# Patient Record
Sex: Male | Born: 1965 | Race: White | Hispanic: No | Marital: Married | Smoking: Never smoker
Health system: Southern US, Community
[De-identification: ages and names within clinical notes are randomized; demographics above are authoritative.]

---

## 2000-11-10 ENCOUNTER — Encounter: Admission: RE | Admit: 2000-11-10 | Discharge: 2000-11-10 | Payer: Self-pay | Admitting: Infectious Diseases

## 2000-12-01 ENCOUNTER — Encounter: Admission: RE | Admit: 2000-12-01 | Discharge: 2000-12-01 | Payer: Self-pay | Admitting: Infectious Diseases

## 2014-09-04 ENCOUNTER — Ambulatory Visit (INDEPENDENT_AMBULATORY_CARE_PROVIDER_SITE_OTHER): Payer: Self-pay | Admitting: Neurology

## 2014-09-04 ENCOUNTER — Encounter: Payer: Self-pay | Admitting: Neurology

## 2014-09-04 ENCOUNTER — Telehealth: Payer: Self-pay | Admitting: Neurology

## 2014-09-04 DIAGNOSIS — Z0289 Encounter for other administrative examinations: Secondary | ICD-10-CM

## 2014-09-04 NOTE — Telephone Encounter (Signed)
duplicate

## 2014-09-04 NOTE — Telephone Encounter (Signed)
Patient no showed for an appointment today, 09/04/2014, at 0830.

## 2014-09-19 NOTE — Progress Notes (Signed)
No show

## 2019-01-31 ENCOUNTER — Other Ambulatory Visit: Payer: Self-pay

## 2019-01-31 ENCOUNTER — Encounter (HOSPITAL_COMMUNITY): Payer: Self-pay | Admitting: Emergency Medicine

## 2019-01-31 ENCOUNTER — Emergency Department (HOSPITAL_COMMUNITY)
Admission: EM | Admit: 2019-01-31 | Discharge: 2019-01-31 | Disposition: A | Discharge status: Home / Self Care | Payer: 59 | Attending: Emergency Medicine | Admitting: Emergency Medicine

## 2019-01-31 ENCOUNTER — Emergency Department (HOSPITAL_COMMUNITY): Payer: 59

## 2019-01-31 DIAGNOSIS — R55 Syncope and collapse: Secondary | ICD-10-CM | POA: Diagnosis present

## 2019-01-31 LAB — CBC WITH DIFFERENTIAL/PLATELET
Abs Immature Granulocytes: 0.02 10*3/uL (ref 0.00–0.07)
Basophils Absolute: 0.1 10*3/uL (ref 0.0–0.1)
Basophils Relative: 1 %
Eosinophils Absolute: 0.1 10*3/uL (ref 0.0–0.5)
Eosinophils Relative: 2 %
HCT: 39.1 % (ref 39.0–52.0)
Hemoglobin: 12.5 g/dL — ABNORMAL LOW (ref 13.0–17.0)
IMMATURE GRANULOCYTES: 0 %
LYMPHS ABS: 2.3 10*3/uL (ref 0.7–4.0)
Lymphocytes Relative: 35 %
MCH: 26.7 pg (ref 26.0–34.0)
MCHC: 32 g/dL (ref 30.0–36.0)
MCV: 83.4 fL (ref 80.0–100.0)
Monocytes Absolute: 0.5 10*3/uL (ref 0.1–1.0)
Monocytes Relative: 8 %
Neutro Abs: 3.6 10*3/uL (ref 1.7–7.7)
Neutrophils Relative %: 54 %
PLATELETS: 246 10*3/uL (ref 150–400)
RBC: 4.69 MIL/uL (ref 4.22–5.81)
RDW: 13.2 % (ref 11.5–15.5)
WBC: 6.6 10*3/uL (ref 4.0–10.5)
nRBC: 0 % (ref 0.0–0.2)

## 2019-01-31 LAB — COMPREHENSIVE METABOLIC PANEL
ALT: 25 U/L (ref 0–44)
AST: 17 U/L (ref 15–41)
Albumin: 3.6 g/dL (ref 3.5–5.0)
Alkaline Phosphatase: 33 U/L — ABNORMAL LOW (ref 38–126)
Anion gap: 7 (ref 5–15)
BUN: 29 mg/dL — ABNORMAL HIGH (ref 6–20)
CO2: 24 mmol/L (ref 22–32)
Calcium: 9 mg/dL (ref 8.9–10.3)
Chloride: 108 mmol/L (ref 98–111)
Creatinine, Ser: 0.97 mg/dL (ref 0.61–1.24)
GFR calc Af Amer: >60 mL/min (ref >60)
GFR calc non Af Amer: >60 mL/min (ref >60)
Glucose, Bld: 134 mg/dL — ABNORMAL HIGH (ref 70–99)
Potassium: 4.7 mmol/L (ref 3.5–5.1)
Sodium: 139 mmol/L (ref 135–145)
Total Bilirubin: 0.6 mg/dL (ref 0.3–1.2)
Total Protein: 6.4 g/dL — ABNORMAL LOW (ref 6.5–8.1)

## 2019-01-31 LAB — I-STAT TROPONIN, ED: Troponin i, poc: 0 ng/mL (ref 0.00–0.08)

## 2019-01-31 LAB — TSH: TSH: 0.864 u[IU]/mL (ref 0.350–4.500)

## 2019-01-31 NOTE — ED Notes (Signed)
Patient verbalizes understanding of discharge instructions. Verbalized understanding of risks leaving AMA. Opportunity for questioning and answers were provided. Armband removed by staff, pt discharged from ED. Ambulated out to lobby with family

## 2019-01-31 NOTE — ED Triage Notes (Signed)
Pt here from gym after syncopal episode , according to staff at gym pt was blue and bystander did approx 10 chest compressions , pt arrived to ED alert and oriented ,

## 2019-01-31 NOTE — Discharge Instructions (Addendum)
You were evaluated today for an episode of syncope.  Your results obtained in the emergency department have been negative thus far, however you did not want to stay in the hospital for further evaluation.  Please do not have any exertional activity until you follow-up with cardiology.  I have listed their contact information on your discharge paperwork.  You will need to call to schedule an appointment.  Please return to the emergency department for any new or worsening symptoms.

## 2019-01-31 NOTE — ED Notes (Signed)
Patient transported to X-ray 

## 2019-01-31 NOTE — ED Provider Notes (Signed)
[facility]ENC[facility] Provider Note   CSN: [id number] Arrival date & time: 01/31/19  1228   History   Chief Complaint Chief Complaint  Patient presents with  . Loss of Consciousness    HPI Jacob Hutchinson is a 53 y.o. male with no significant past medical history who presents for evaluation after syncopal event.  Patient states he was working out at [employer] when he became lightheaded and dizzy.  He looked at his wife and told her he did not "feel well."  Wife patient states that patient slumped over at that time.  Wife states patient's lips turned blue and appeared to be not breathing. They lowered patient to the ground. By standards at gym could not find a pulse on patient. 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  EMS states patient did have a faint radial pulse on evaluation.  Denies preceding headache, vision changes, neck pain, neck stiffness, chest pain, shortness of breath, abdominal pain, nausea, vomiting, diarrhea or dysuria.  Patient states he did have a syncopal event approximately 10 years ago however a syncopal event since.  Denies history of hypertension, hypercholesterolemia, family history of early death or early MI, diabetes.  Denies fever, chills, nausea, vomiting, chest pain, shortness of breath, abdominal pain, diarrhea or dysuria.  History obtained from patient and family.  No interpreter was used.  HPI  History reviewed. No pertinent past medical history.  There are no active problems to display for this patient.   History reviewed. No pertinent surgical history.      Home Medications    Prior to Admission medications   Not on File    Family History History reviewed. No pertinent family history.  Social History Social History   Tobacco Use  . Smoking status: Never Smoker  . Smokeless tobacco: Never Used    Substance Use Topics  . Alcohol use: Yes    Comment: social   . Drug use: Not on file     Allergies   Patient has no known allergies.   Review of Systems Review of Systems  Constitutional: Negative.   HENT: Negative.   Eyes: Negative.   Respiratory: Negative.   Cardiovascular: Negative.   Gastrointestinal: Negative.   Genitourinary: Negative.   Musculoskeletal: Negative.   Skin: Negative.   Neurological: Positive for syncope. Negative for dizziness, tremors, seizures, facial asymmetry, speech difficulty, weakness, light-headedness, numbness and headaches.  All other systems reviewed and are negative.    Physical Exam Updated Vital Signs BP 108/64   Pulse 75   Temp 98.4 F (36.9 C) (Oral)   Resp 20   Wt 77.1 kg   SpO2 99%   Physical Exam Vitals signs and nursing note reviewed.  Constitutional:      General: He is not in acute distress.    Appearance: He is well-developed. He is not ill-appearing, toxic-appearing or diaphoretic.  HENT:     Head: Normocephalic and atraumatic.     Nose: Nose normal.     Mouth/Throat:     Comments: Mucous membranes moist. Eyes:     Pupils: Pupils are equal, round, and reactive to light.     Comments: No horizontal or rotational nystagmus.  Neck:     Musculoskeletal: Normal range of motion and neck supple.     Comments: No neck stiffness or neck rigidity.  No cervical lymphadenopathy.  No meningismus. Cardiovascular:     Rate and Rhythm: Normal rate and regular rhythm.     Pulses: Normal pulses.     Heart sounds: Normal heart sounds. No murmur. No gallop.   Pulmonary:     Effort: Pulmonary effort is normal. No respiratory distress.     Comments: Clear to auscultation bilaterally without wheeze, rhonchi or rales.  Able speak in full sentences without difficulty.  No accessory muscle usage. Abdominal:     General: There is no distension.     Palpations: Abdomen is soft.     Comments: Soft, nontender without rebound or  guarding.  No CVA tenderness.  Musculoskeletal: Normal range of motion.     Comments: Moves all extremities without difficulty.  Skin:    General: Skin is warm and dry.  Neurological:     Mental Status: He is alert.     Comments: Mental Status:  Alert, oriented, thought content appropriate. Speech fluent without evidence of aphasia. Able to follow 2 step commands without difficulty.  Cranial Nerves:  II:  Peripheral visual fields grossly normal, pupils equal, round, reactive to light III,IV, VI: ptosis not present, extra-ocular motions intact bilaterally  V,VII: smile symmetric, facial light touch sensation equal VIII: hearing grossly normal bilaterally  IX,X: midline uvula rise  XI: bilateral shoulder shrug equal and strong XII: midline tongue extension  Motor:  5/5 in upper and lower extremities bilaterally including strong and equal grip strength and dorsiflexion/plantar flexion Sensory: Pinprick and light touch normal in all extremities.  Deep Tendon Reflexes: 2+ and symmetric  Cerebellar: normal finger-to-nose with bilateral upper extremities Gait: normal gait and balance CV: distal pulses palpable throughout       ED Treatments / Results  Labs (all labs ordered are listed, but only abnormal results are displayed) Labs Reviewed  CBC WITH DIFFERENTIAL/PLATELET - Abnormal; Notable for the following components:      Result Value   Hemoglobin 12.5 (*)    All other components within normal limits  COMPREHENSIVE METABOLIC PANEL - Abnormal; Notable for the following components:   Glucose, Bld 134 (*)    BUN 29 (*)    Total Protein 6.4 (*)    Alkaline Phosphatase 33 (*)    All other components within normal limits  TSH  I-STAT TROPONIN, ED    EKG EKG Interpretation  Date/Time:  Tuesday January 31 2019 12:37:03 EST Ventricular Rate:  58 PR Interval:    QRS Duration: 85 QT Interval:  396 QTC Calculation: 389 R Axis:   49 Text Interpretation:  Sinus rhythm Confirmed  by Kennis Carina [phone]) on 01/31/2019 12:50:17 PM   Radiology Dg Chest 2 View  Result Date: 01/31/2019 CLINICAL DATA:  Pt passed out while working out today. No other chest complaints. No hx of heart or lung problems. Pt is a nonsmoker. EXAM: CHEST - 2 VIEW COMPARISON:  03/24/2015 FINDINGS: The heart size and mediastinal contours are within normal limits. Both lungs are clear. The visualized skeletal structures are unremarkable. IMPRESSION: No active cardiopulmonary disease. Electronically Signed   By: Norva Pavlov Jacob.D.   On: 01/31/2019 13:18    Procedures Procedures (including critical care time)  Medications Ordered in ED Medications - No data to display   Initial Impression / Assessment and Plan / ED Course  I have reviewed the triage vital signs and the nursing notes.  Pertinent labs & imaging results that were available during my care of the patient were reviewed by me and  considered in my medical decision making (see chart for details).  53 year old male who presents for evaluation of syncopal event.  Afebrile, nonseptic, non-ill-appearing.  Patient with exertional syncope with patient's family stating his lips turned blue and he was unconscious.  Unable to find pulse per nurse at gym.  EMS was called.  They were going to begin chest compressions and AICD unit when patient had spontaneous resolvent of symptoms.  No history of hypertension, hyperlipidemia, diabetes, family history of early MI.  Prior history of syncope 10 years ago, however has not had any episodes since.  Nonfocal neurologic exam without neurologic deficits. Lungs clear to auscultation bilaterally without wheeze rhonchi or rales.  No active chest pain, shortness of breath, diaphoresis or nausea. Concern for cardiac etiology of patient syncopal event. Discussed likely need for possible inpatient workup. Patient adamantly refuses possible admission. Patient seen by Dr. Pilar Plate and again patient refuses possible inpatient  status. Will obtain labs, imagine and reevaluate.   1330: EKG without ischemic changes, chest x-ray negative for infiltrates, cardiomegaly, pulmonary edema, pneumothorax.  Initial troponin negative.  TSH, Metabolic panel, CBC pending.  1445: On reevaluation patient without any complaints, CBC with hemoglobin 12.5, metabolic panel with mild hyperglycemia 134, BUN 29, no additional electrolyte, renal or liver abnormalities. TSH pending. Thorough discussion with patient recommendation for inpatient management for additional work-up.  Patient declines additional work-up at this time and has chosen to leave AGAINST MEDICAL ADVICE.  Thorough discussion with attending physician, patient and I with risks vs benefit of leaving without continued work-up.  Patient and wife voiced understanding at this time.  They continue to want to leave AGAINST MEDICAL ADVICE.  Discussed follow-up with cardiology for his symptoms.  Discussed cessation of exertional activity until he can follow-up with Cardiology.  Thorough discussion with patient to return to the emergency department for any new or worsening symptoms or if he changes his mind for additional work-up.  Patient and family voiced understanding.   Patient has been seen and evaluated today by my attending Dr. Pilar Plate who has seen and personally evaluated patient and agrees with above treatment, plan and disposition.  Final Clinical Impressions(s) / ED Diagnoses   Final diagnoses:  Syncope and collapse    ED Discharge Orders    None       Sadi Arave A, PA-C 01/31/19 1454    Sabas Sous, MD 01/31/19 1528

## 2020-02-13 IMAGING — DX DG CHEST 2V
2 series · 2 of 2 positions shown · non-contrast
Comparison: 03/24/2015

CLINICAL DATA: Pt passed out while working out today. No other
chest complaints. No hx of heart or lung problems. Pt is a
nonsmoker.

EXAM:
CHEST - 2 VIEW

[chest lat]
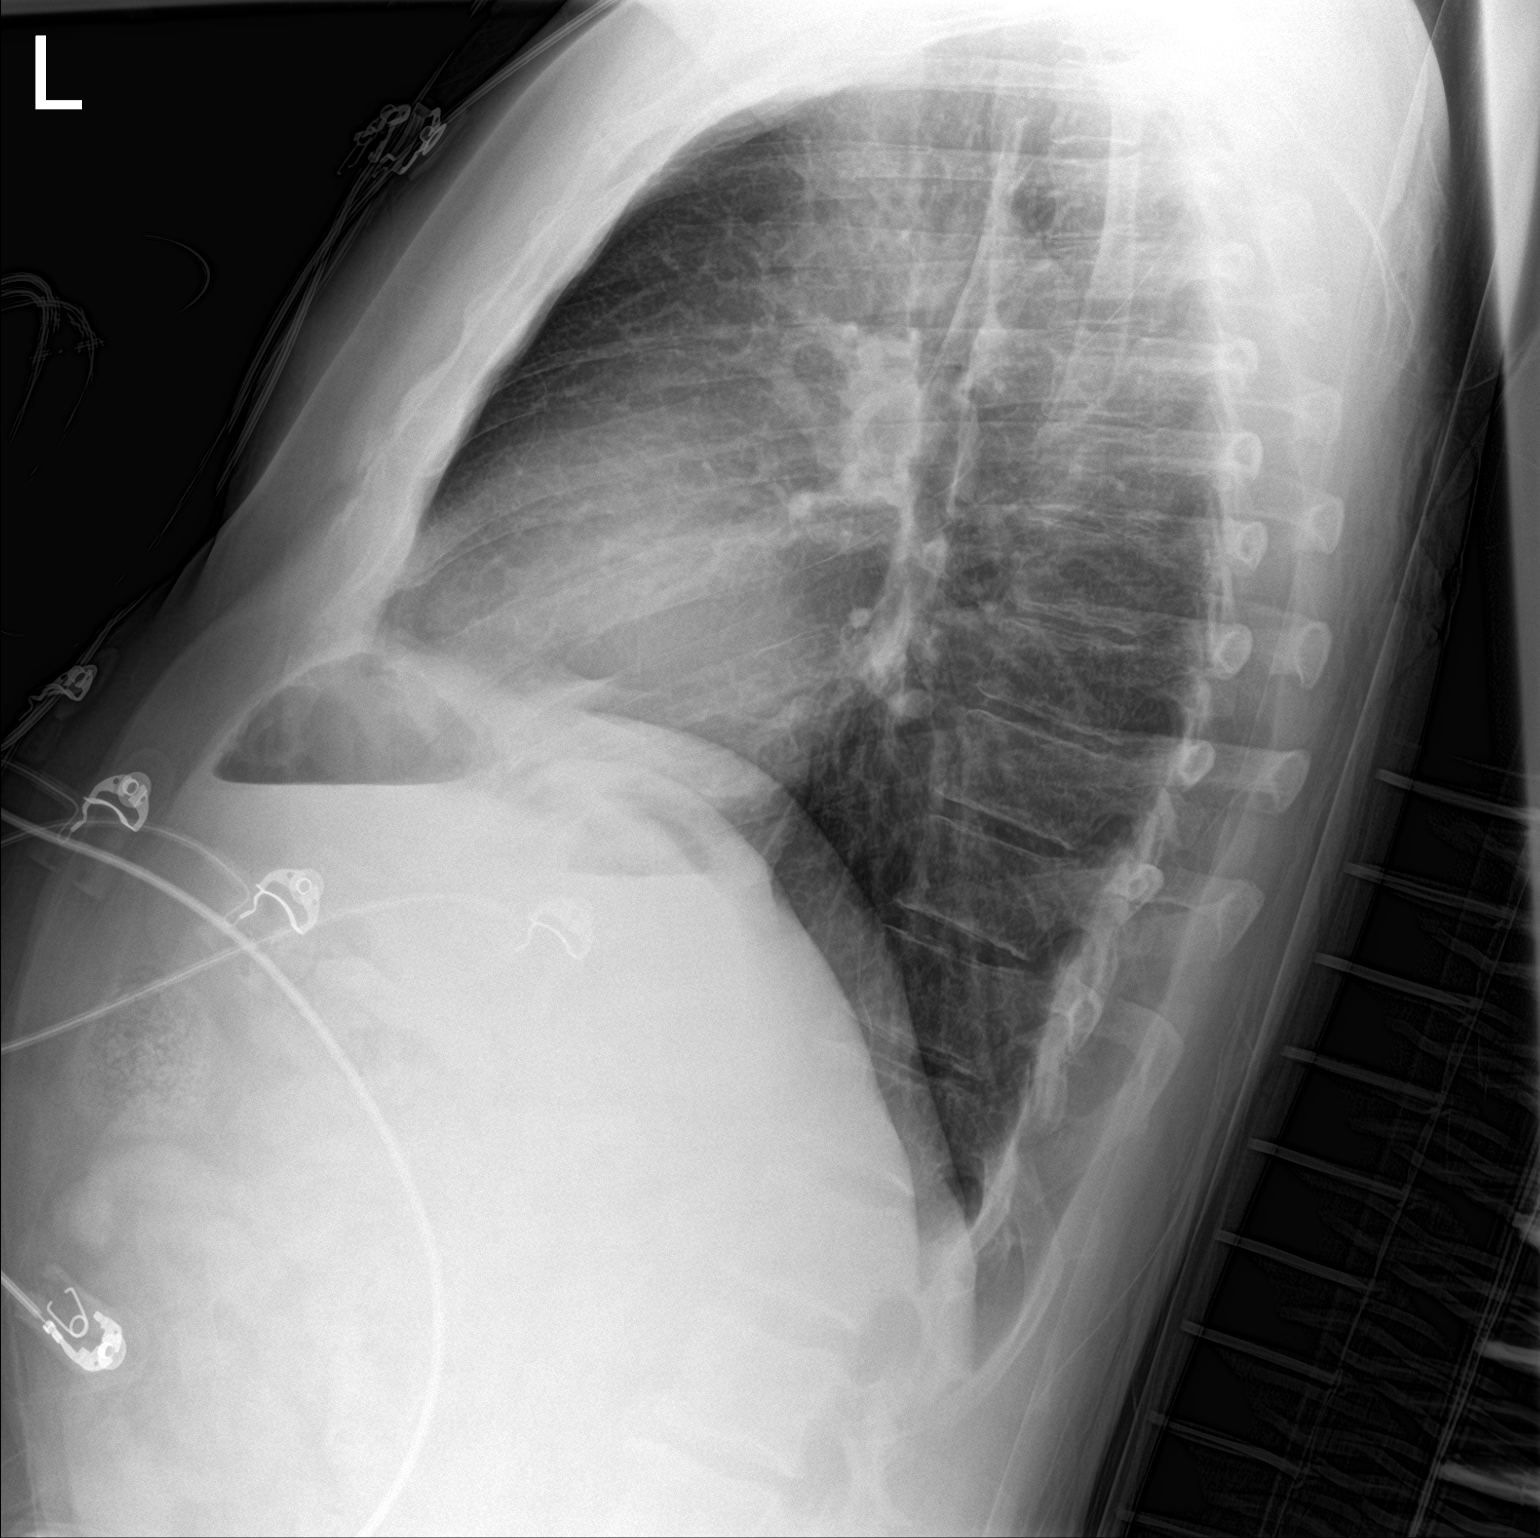

[chest ap]
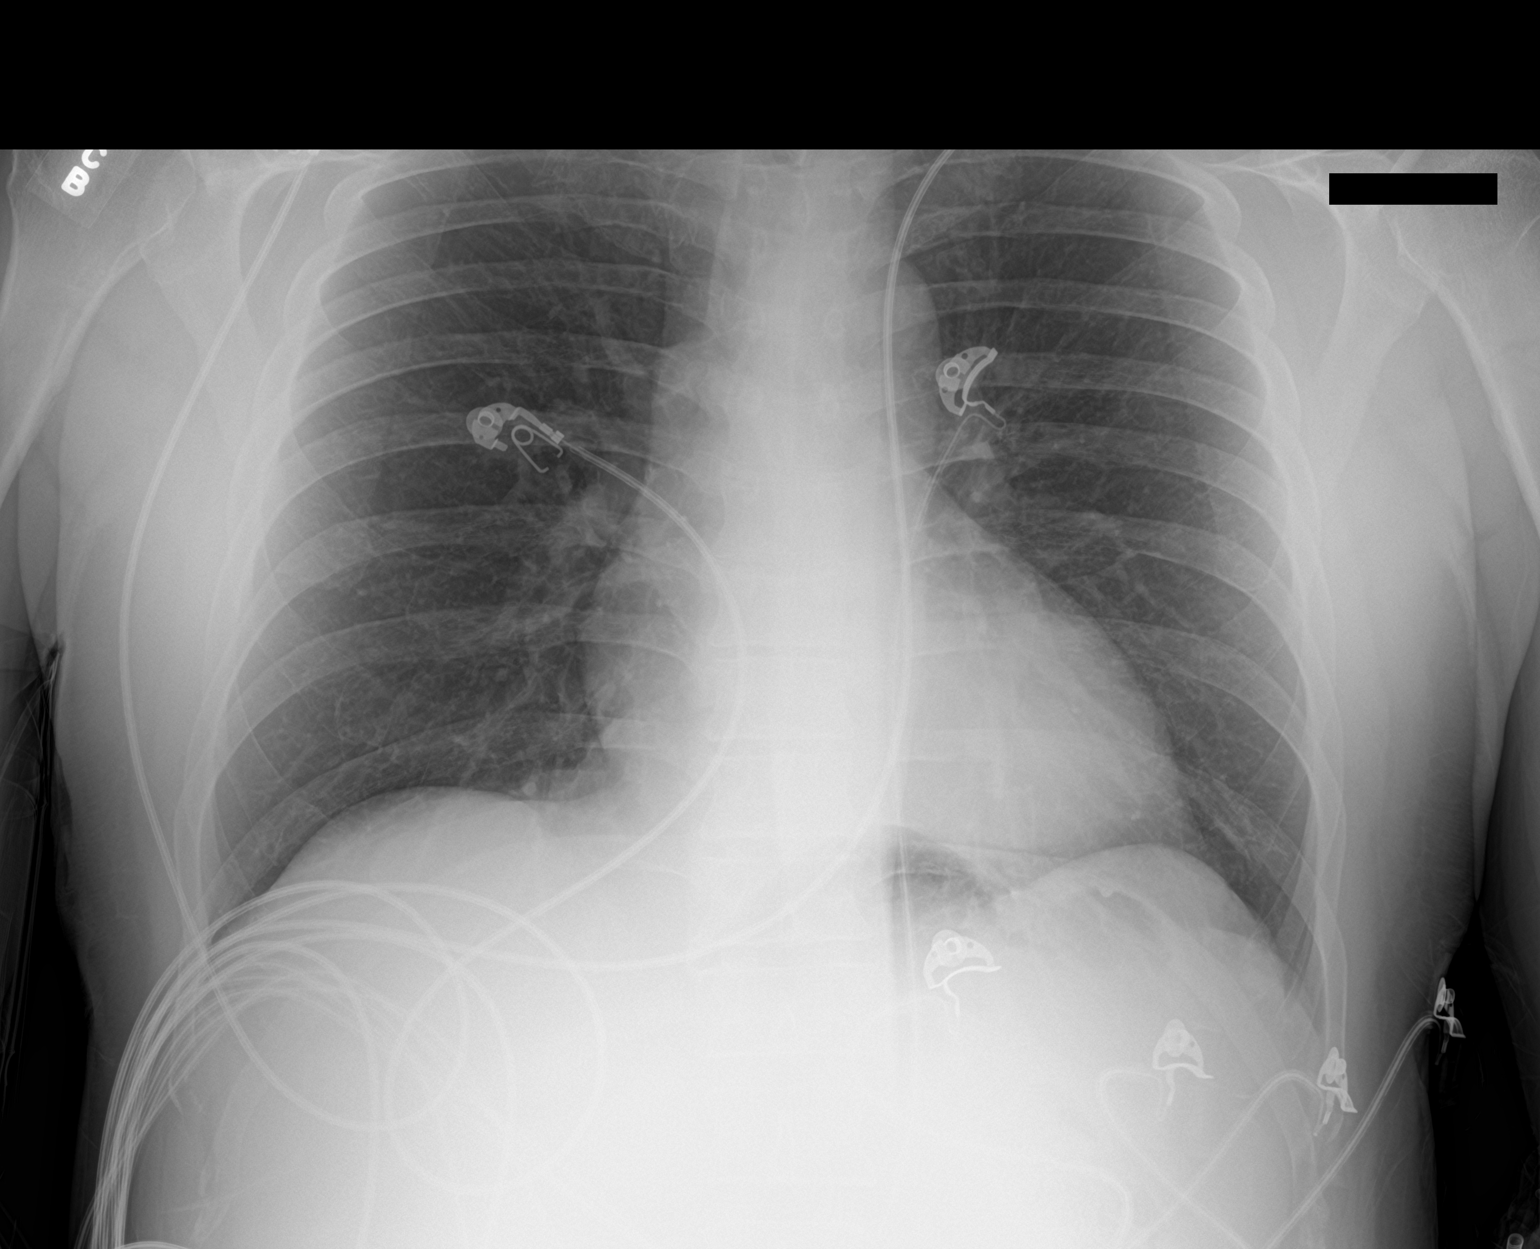

[2 of 2 positions shown; findings below may reference images not displayed]

FINDINGS: The heart size and mediastinal contours are within normal limits.
Both lungs are clear. The visualized skeletal structures are
unremarkable.
IMPRESSION: No active cardiopulmonary disease.
